# Patient Record
Sex: Male | Born: 1990 | Race: Black or African American | Hispanic: No | Marital: Single | Smoking: Current every day smoker
Health system: Southern US, Community
[De-identification: ages and names within clinical notes are randomized; demographics above are authoritative.]

---

## 2014-08-19 ENCOUNTER — Encounter (HOSPITAL_COMMUNITY): Payer: Self-pay | Admitting: Emergency Medicine

## 2014-08-19 ENCOUNTER — Emergency Department (HOSPITAL_COMMUNITY)
Admission: EM | Admit: 2014-08-19 | Discharge: 2014-08-19 | Disposition: A | Discharge status: Home / Self Care | Payer: No Typology Code available for payment source | Source: Home / Self Care | Attending: Family Medicine | Admitting: Family Medicine

## 2014-08-19 ENCOUNTER — Emergency Department (INDEPENDENT_AMBULATORY_CARE_PROVIDER_SITE_OTHER): Payer: No Typology Code available for payment source

## 2014-08-19 DIAGNOSIS — S86011A Strain of right Achilles tendon, initial encounter: Secondary | ICD-10-CM | POA: Diagnosis not present

## 2014-08-19 NOTE — Discharge Instructions (Signed)
Wear boot as needed, use ice and motrin as needed for soreness, activityguided by pain and limited by boot, see orthopedist next week for recheck before resuming full sports activity.

## 2014-08-19 NOTE — ED Provider Notes (Signed)
CSN: [id number]     Arrival date & time 08/19/14  1850 History   First MD Initiated Contact with Patient 08/19/14 1933     Chief Complaint  Patient presents with  . Ankle Pain   (Consider location/radiation/quality/duration/timing/severity/associated sxs/prior Treatment) Patient is a 24 y.o. male presenting with ankle pain. The history is provided by the patient.  Ankle Pain Location:  Ankle Time since incident:  10 days Injury: yes   Mechanism of injury comment:  Playing basketball and another player came down on ankle has had pain since. Ankle location:  R ankle Pain details:    Quality:  Sharp and shooting Associated symptoms: no back pain     History reviewed. No pertinent past medical history. History reviewed. No pertinent past surgical history. No family history on file. History  Substance Use Topics  . Smoking status: Current Every Day Smoker  . Smokeless tobacco: Not on file  . Alcohol Use: No    Review of Systems  Constitutional: Negative.   Musculoskeletal: Positive for gait problem. Negative for back pain and joint swelling.  Skin: Negative.     Allergies  Review of patient's allergies indicates no known allergies.  Home Medications   Prior to Admission medications   Not on File   BP 124/73 mmHg  Pulse 71  Temp(Src) 97.6 F (36.4 C) (Oral)  Resp 12  SpO2 100% Physical Exam  Constitutional: He is oriented to person, place, and time. He appears well-developed and well-nourished. No distress.  Musculoskeletal: Normal range of motion. He exhibits tenderness.       Right ankle: He exhibits normal range of motion, no swelling, no ecchymosis, no deformity and normal pulse. No lateral malleolus, no medial malleolus, no head of 5th metatarsal and no proximal fibula tenderness found. Achilles tendon exhibits pain. Achilles tendon exhibits no defect and normal Thompson's test results.       Feet:  Neurological: He is alert and oriented to person, place, and  time.  Skin: Skin is warm and dry.  Nursing note and vitals reviewed.   ED Course  Procedures (including critical care time) Labs Review Labs Reviewed - No data to display  Imaging Review Dg Ankle Complete Right  08/19/2014   CLINICAL DATA:  Right ankle pain, basketball injury  EXAM: RIGHT ANKLE - COMPLETE 3+ VIEW  COMPARISON:  None.  FINDINGS: No fracture or dislocation is seen.  The ankle mortise is intact.  The base of the fifth metatarsal is unremarkable.  Mild lateral soft tissue swelling.  IMPRESSION: No fracture or dislocation is seen.   Electronically Signed   By: Charline Bills M.D.   On: 08/19/2014 19:50   X-rays reviewed and report per radiologist.   MDM   1. Strain of Achilles tendon, right, initial encounter        Linna Hoff, MD 08/19/14 2021

## 2014-08-19 NOTE — ED Notes (Signed)
Ankle pain, right ankle pain after playing basketball last Thursday.  Other player stepped on the back of foot/achilles Patient is also concerned for blod noted on tissue paper when wiping

## 2016-02-06 IMAGING — DX DG ANKLE COMPLETE 3+V*R*
3 series · 3 of 3 positions shown · non-contrast
Comparison: None.

CLINICAL DATA: Right ankle pain, basketball injury

EXAM:
RIGHT ANKLE - COMPLETE 3+ VIEW

[ankle ap]
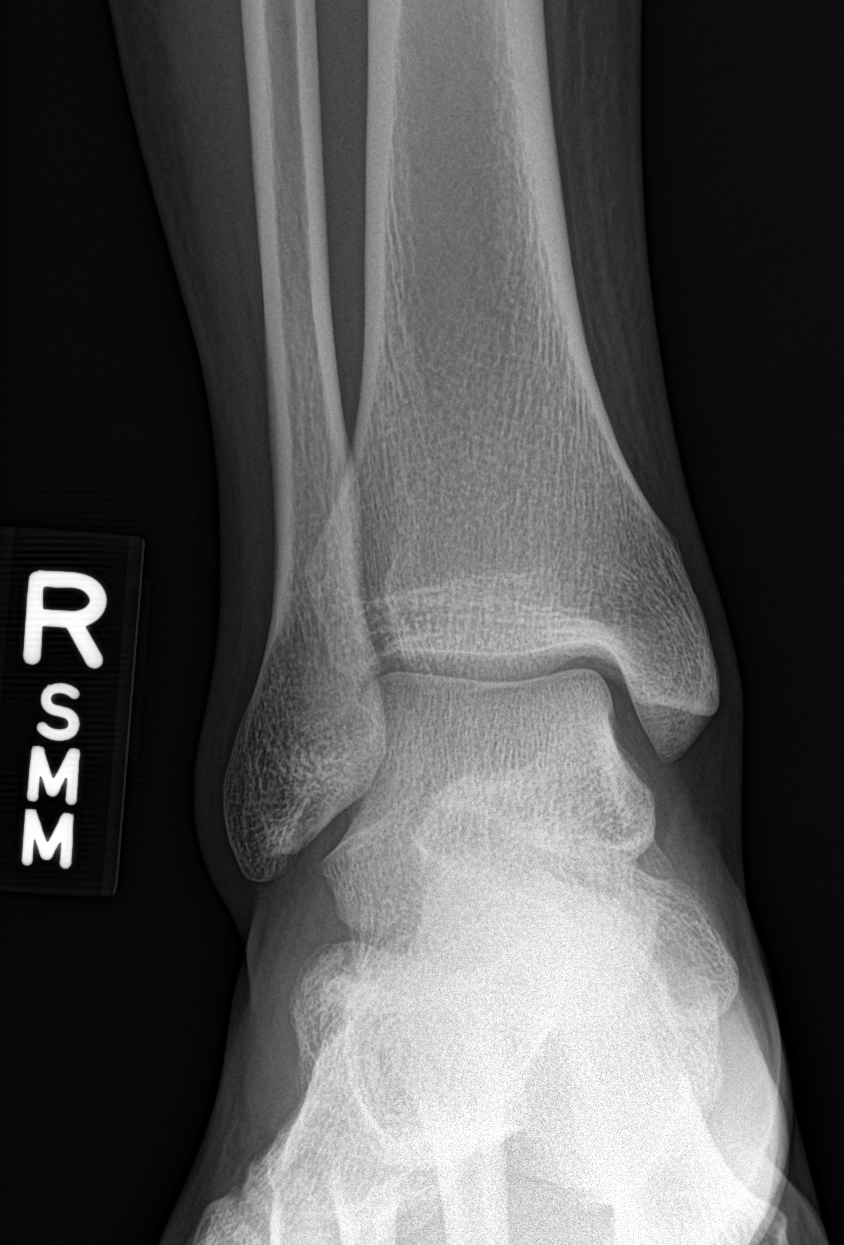

[ankle obl]
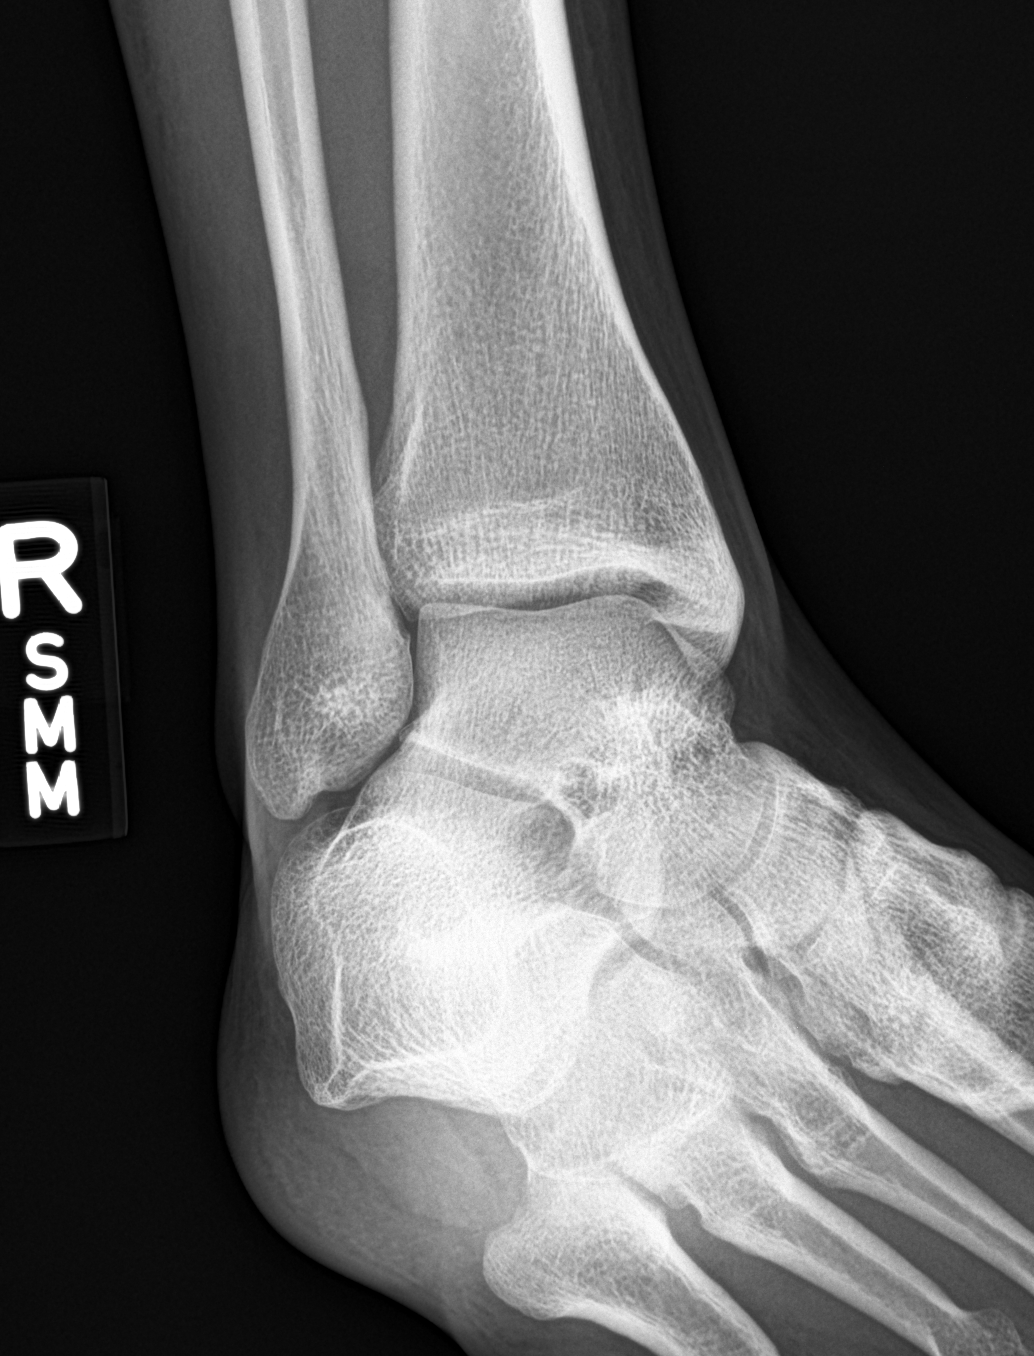

[ankle lat]
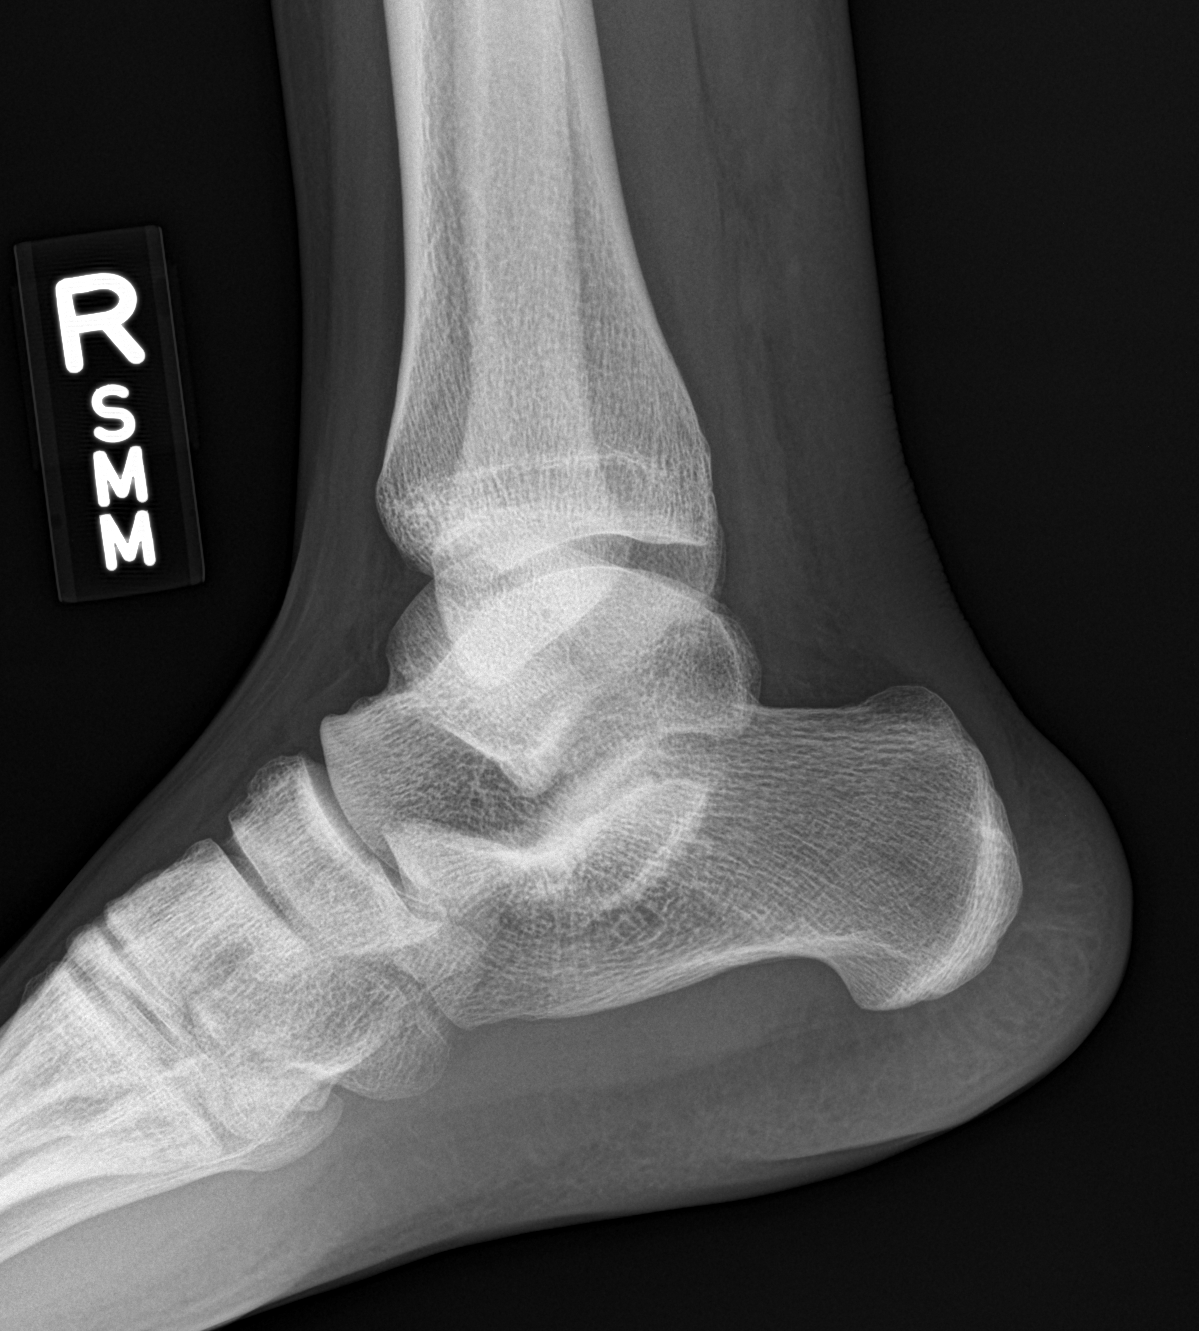

[3 of 3 positions shown; findings below may reference images not displayed]

FINDINGS: No fracture or dislocation is seen.

The ankle mortise is intact.

The base of the fifth metatarsal is unremarkable.

Mild lateral soft tissue swelling.
IMPRESSION: No fracture or dislocation is seen.

## 2020-12-27 ENCOUNTER — Encounter (HOSPITAL_COMMUNITY): Payer: Self-pay | Admitting: *Deleted

## 2020-12-27 ENCOUNTER — Other Ambulatory Visit: Payer: Self-pay

## 2020-12-27 ENCOUNTER — Ambulatory Visit (HOSPITAL_COMMUNITY)
Admission: EM | Admit: 2020-12-27 | Discharge: 2020-12-27 | Disposition: A | Discharge status: Home / Self Care | Payer: No Typology Code available for payment source | Attending: Emergency Medicine | Admitting: Emergency Medicine

## 2020-12-27 DIAGNOSIS — H16002 Unspecified corneal ulcer, left eye: Secondary | ICD-10-CM

## 2020-12-27 MED ORDER — IBUPROFEN 600 MG PO TABS: 600.0000 mg | ORAL_TABLET | Freq: Four times a day (QID) | ORAL | 0 refills | Status: AC | PRN

## 2020-12-27 MED ORDER — TETRACAINE HCL 0.5 % OP SOLN
1.0000 [drp] | Freq: Once | OPHTHALMIC | Status: AC
  Administered 2020-12-27: 2 [drp] via OPHTHALMIC

## 2020-12-27 MED ORDER — TETRACAINE HCL 0.5 % OP SOLN
OPHTHALMIC | Status: AC
  Filled 2020-12-27: qty 4

## 2020-12-27 MED ORDER — FLUORESCEIN SODIUM 1 MG OP STRP
1.0000 | ORAL_STRIP | Freq: Once | OPHTHALMIC | Status: AC
  Administered 2020-12-27: 1 via OPHTHALMIC

## 2020-12-27 MED ORDER — MOXIFLOXACIN HCL 0.5 % OP SOLN: 1.0000 [drp] | Freq: Three times a day (TID) | OPHTHALMIC | 0 refills | Status: AC

## 2020-12-27 MED ORDER — FLUORESCEIN SODIUM 1 MG OP STRP
ORAL_STRIP | OPHTHALMIC | Status: AC
  Filled 2020-12-27: qty 1

## 2020-12-27 NOTE — ED Provider Notes (Signed)
HPI  SUBJECTIVE:  Daniel Gutierrez is a 30 y.o. male who presents with left eye redness, pain described as soreness.  States that he woke up with it.  He wears contacts, but states he takes them out every night.  He did not sleep in them last night.  He reports photophobia, pain with eye movement.  No foreign body sensation, visual changes, known trauma to the eye, chemical exposure, headache, nausea, vomiting, halos around lights, eye pain worse in the dark, periorbital erythema, edema. he has never had symptoms like this before.  No allergy symptoms.  He tried Visine without improvement in his symptoms.  Symptoms are worse with exposure to light.  He does not have glasses.  Ophthalmology: None   History reviewed. No pertinent past medical history.  History reviewed. No pertinent surgical history.  History reviewed. No pertinent family history.  Social History   Tobacco Use   Smoking status: Every Day    Types: Cigarettes   Smokeless tobacco: Never  Substance Use Topics   Alcohol use: No   Drug use: Yes    No current facility-administered medications for this encounter.  Current Outpatient Medications:    ibuprofen (ADVIL) 600 MG tablet, Take 1 tablet (600 mg total) by mouth every 6 (six) hours as needed., Disp: 30 tablet, Rfl: 0   moxifloxacin (VIGAMOX) 0.5 % ophthalmic solution, Place 1 drop into the left eye 3 (three) times daily. 1 to 2 drops in your left eye every hour for the next 24 hours., Disp: 3 mL, Rfl: 0  No Known Allergies   ROS  As noted in HPI.   Physical Exam  BP 122/82   Pulse 91   Temp 98.8 F (37.1 C)   Resp 20   SpO2 98%   Constitutional: Well developed, well nourished, no acute distress Eyes:  PERRLA, EOMI, left conjunctival injection.  Positive direct photophobia.  No consensual photophobia.  Corneal ulcer at the 230 o'clock position. negative Seidel.  No foreign body seen on lid eversion.  No pain with EOMs, no Periorbital erythema, edema. Corrected  visual acuity: Right 20/15 Left: 20/15       HENT: Normocephalic, atraumatic,mucus membranes moist Respiratory: Normal inspiratory effort Cardiovascular: Normal rate GI: nondistended skin: No rash, skin intact Musculoskeletal: no deformities Neurologic: Alert & oriented x 3, no focal neuro deficits Psychiatric: Speech and behavior appropriate   ED Course   Medications  fluorescein ophthalmic strip 1 strip (1 strip Left Eye Given by Other 12/27/20 1602)  tetracaine (PONTOCAINE) 0.5 % ophthalmic solution 1-2 drop (2 drops Left Eye Given by Other 12/27/20 1603)    Orders Placed This Encounter  Procedures   Visual acuity screening    Standing Status:   Standing    Number of Occurrences:   1    No results found for this or any previous visit (from the past 24 hour(s)). No results found.  ED Clinical Impression  1. Corneal ulcer of left eye      ED Assessment/Plan  Patient with a corneal ulcer.  Discussed case with Dr. Allena Katz, ophthalmology on-call.  Home with Vigamox, 1 to 2 drops every hours for the next 24 hours.  Every 2 or 3 hours at night.  No contacts.  It is imperative that he follow-up with Dr. Allena Katz tomorrow.  This can be a vision threatening condition.  Emphasized importance to patient of following up with ophthalmology.  Tylenol/ibuprofen, cool compresses, sunglasses for photophobia.  Work note.  Discussed l, MDM, treatment plan,  and plan for follow-up with patient. Discussed sn/sx that should prompt return to the ED. patient agrees with plan.   Meds ordered this encounter  Medications   fluorescein ophthalmic strip 1 strip   tetracaine (PONTOCAINE) 0.5 % ophthalmic solution 1-2 drop   moxifloxacin (VIGAMOX) 0.5 % ophthalmic solution    Sig: Place 1 drop into the left eye 3 (three) times daily. 1 to 2 drops in your left eye every hour for the next 24 hours.    Dispense:  3 mL    Refill:  0   ibuprofen (ADVIL) 600 MG tablet    Sig: Take 1 tablet (600 mg  total) by mouth every 6 (six) hours as needed.    Dispense:  30 tablet    Refill:  0      *This clinic note was created using Scientist, clinical (histocompatibility and immunogenetics). Therefore, there may be occasional mistakes despite careful proofreading.  ?    Domenick Gong, MD 12/28/20 1023

## 2020-12-27 NOTE — Discharge Instructions (Addendum)
It is absolutely imperative that you see Dr. Allena Katz, ophthalmology, tomorrow.  This can be a vision threatening condition.  1 to 2 drops of Vigamox in your left eye every hour for the next 24 hours.  You need to wake up every 2-3 hours overnight to put drops in.  Take 600 mg of ibuprofen combined with 1000 mg of Tylenol together 3-4 times a day as needed for pain.  Cool compresses, sunglasses if lights hurt your eyes.  Do not rub your eye.  You cannot wear contacts in this eye until it has healed.

## 2020-12-27 NOTE — ED Triage Notes (Signed)
Pt woke up with Lt eye problem. Sclera is red. Pt reports he still has his contact in the Lt eye.
